# Patient Record
Sex: Male | Born: 1980 | Race: Black or African American | Hispanic: No | Marital: Married | Smoking: Current every day smoker
Health system: Southern US, Community
[De-identification: ages and names within clinical notes are randomized; demographics above are authoritative.]

## PROBLEM LIST (undated history)

## (undated) HISTORY — PX: ELBOW SURGERY: SHX618

## (undated) HISTORY — PX: FRACTURE SURGERY: SHX138

---
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
## 2005-05-14 ENCOUNTER — Ambulatory Visit (HOSPITAL_COMMUNITY): Admission: RE | Admit: 2005-05-14 | Discharge: 2005-05-14 | Payer: Self-pay | Admitting: Emergency Medicine

## 2005-05-14 ENCOUNTER — Emergency Department (HOSPITAL_COMMUNITY): Admission: EM | Admit: 2005-05-14 | Discharge: 2005-05-14 | Payer: Self-pay | Admitting: Emergency Medicine

## 2005-05-18 ENCOUNTER — Ambulatory Visit (HOSPITAL_COMMUNITY): Admission: RE | Admit: 2005-05-18 | Discharge: 2005-05-19 | Payer: Self-pay | Admitting: Otolaryngology

## 2005-06-20 ENCOUNTER — Ambulatory Visit (HOSPITAL_COMMUNITY): Admission: RE | Admit: 2005-06-20 | Discharge: 2005-06-20 | Payer: Self-pay | Admitting: Otolaryngology

## 2005-06-22 ENCOUNTER — Ambulatory Visit (HOSPITAL_COMMUNITY): Admission: RE | Admit: 2005-06-22 | Discharge: 2005-06-22 | Payer: Self-pay | Admitting: Otolaryngology

## 2009-02-15 ENCOUNTER — Emergency Department (HOSPITAL_COMMUNITY): Admission: EM | Admit: 2009-02-15 | Discharge: 2009-02-15 | Payer: Self-pay | Admitting: Emergency Medicine

## 2009-04-09 ENCOUNTER — Emergency Department (HOSPITAL_COMMUNITY): Admission: EM | Admit: 2009-04-09 | Discharge: 2009-04-09 | Payer: Self-pay | Admitting: Family Medicine

## 2009-04-27 ENCOUNTER — Emergency Department (HOSPITAL_COMMUNITY): Admission: EM | Admit: 2009-04-27 | Discharge: 2009-04-27 | Payer: Self-pay | Admitting: Family Medicine

## 2010-07-08 NOTE — Op Note (Signed)
NAME:  Gary Hughes, Gary Hughes             ACCOUNT NO.:  [id number]   MEDICAL RECORD NO.:  [id number]          PATIENT TYPE:  AMB   LOCATION:  SDS                          FACILITY:  [facility]   PHYSICIAN:  Suzanna Obey, M.D.       DATE OF BIRTH:  Dec 21, 1980   DATE OF PROCEDURE:  06/22/2005  DATE OF DISCHARGE:                                 OPERATIVE REPORT   PREOPERATIVE DIAGNOSIS:  Mandibulomaxillary fixation.   POSTOPERATIVE DIAGNOSIS:  Mandibulomaxillary fixation.   OPERATION PERFORMED:  Removal of wires and screws.   SURGEON:  Suzanna Obey, M.D.   ANESTHESIA:  Local and MAC.   ESTIMATED BLOOD LOSS:  The estimated blood loss was less than 5 mL.   INDICATIONS:  This is a 30 year old with a previous mandibular fracture.  He  has now been wired for about five weeks.  He is not having any pain.  The  Panorex looks like the fracture line is still in line.  He was informed the  risks and benefits of the procedure.  All his questions were answered and  consent was obtained.   DESCRIPTION OF THE OPERATION:  The patient was brought to the operating room  and sedated with a local as well as anesthesia.  He was clipped of his  wires.  The wires were removed from around the screws and the screws were  removed after making a small incision over the mucosa of the head of the  screw.  They were removed without difficulty.   The patient tolerated this very well.  The patient was taken to recovery in  stable condition.   COUNTS:  The counts were correct.           ______________________________  Suzanna Obey, M.D.     JB/MEDQ  D:  06/22/2005  T:  06/23/2005  Job:  [id number]
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
## 2010-07-11 ENCOUNTER — Emergency Department (HOSPITAL_COMMUNITY): Payer: Self-pay

## 2010-07-11 ENCOUNTER — Emergency Department (HOSPITAL_COMMUNITY)
Admission: EM | Admit: 2010-07-11 | Discharge: 2010-07-11 | Disposition: A | Discharge status: Home / Self Care | Payer: Self-pay | Attending: Emergency Medicine | Admitting: Emergency Medicine

## 2010-07-11 DIAGNOSIS — S61209A Unspecified open wound of unspecified finger without damage to nail, initial encounter: Secondary | ICD-10-CM | POA: Insufficient documentation

## 2010-07-11 DIAGNOSIS — R110 Nausea: Secondary | ICD-10-CM | POA: Insufficient documentation

## 2010-07-11 DIAGNOSIS — M79609 Pain in unspecified limb: Secondary | ICD-10-CM | POA: Insufficient documentation

## 2010-07-11 DIAGNOSIS — W28XXXA Contact with powered lawn mower, initial encounter: Secondary | ICD-10-CM | POA: Insufficient documentation

## 2010-07-11 DIAGNOSIS — S62639A Displaced fracture of distal phalanx of unspecified finger, initial encounter for closed fracture: Secondary | ICD-10-CM | POA: Insufficient documentation

## 2010-07-11 DIAGNOSIS — F17200 Nicotine dependence, unspecified, uncomplicated: Secondary | ICD-10-CM | POA: Insufficient documentation

## 2010-07-25 ENCOUNTER — Inpatient Hospital Stay (INDEPENDENT_AMBULATORY_CARE_PROVIDER_SITE_OTHER)
Admission: RE | Admit: 2010-07-25 | Discharge: 2010-07-25 | Disposition: A | Discharge status: Home / Self Care | Payer: Self-pay | Source: Ambulatory Visit | Attending: Family Medicine | Admitting: Family Medicine

## 2010-07-25 DIAGNOSIS — Z0489 Encounter for examination and observation for other specified reasons: Secondary | ICD-10-CM

## 2010-11-22 ENCOUNTER — Emergency Department (HOSPITAL_COMMUNITY)
Admission: EM | Admit: 2010-11-22 | Discharge: 2010-11-22 | Disposition: A | Discharge status: Home / Self Care | Payer: Self-pay | Attending: Emergency Medicine | Admitting: Emergency Medicine

## 2010-11-22 DIAGNOSIS — K089 Disorder of teeth and supporting structures, unspecified: Secondary | ICD-10-CM | POA: Insufficient documentation

## 2010-11-22 DIAGNOSIS — K029 Dental caries, unspecified: Secondary | ICD-10-CM | POA: Insufficient documentation

## 2011-07-28 ENCOUNTER — Encounter (HOSPITAL_COMMUNITY): Payer: Self-pay

## 2011-07-28 ENCOUNTER — Emergency Department (HOSPITAL_COMMUNITY)
Admission: EM | Admit: 2011-07-28 | Discharge: 2011-07-28 | Disposition: A | Discharge status: Home / Self Care | Payer: Self-pay | Attending: Emergency Medicine | Admitting: Emergency Medicine

## 2011-07-28 DIAGNOSIS — X131XXA Other contact with steam and other hot vapors, initial encounter: Secondary | ICD-10-CM | POA: Insufficient documentation

## 2011-07-28 DIAGNOSIS — T22139A Burn of first degree of unspecified upper arm, initial encounter: Secondary | ICD-10-CM | POA: Insufficient documentation

## 2011-07-28 DIAGNOSIS — T22119A Burn of first degree of unspecified forearm, initial encounter: Secondary | ICD-10-CM | POA: Insufficient documentation

## 2011-07-28 DIAGNOSIS — T300 Burn of unspecified body region, unspecified degree: Secondary | ICD-10-CM

## 2011-07-28 DIAGNOSIS — F17200 Nicotine dependence, unspecified, uncomplicated: Secondary | ICD-10-CM | POA: Insufficient documentation

## 2011-07-28 DIAGNOSIS — X12XXXA Contact with other hot fluids, initial encounter: Secondary | ICD-10-CM | POA: Insufficient documentation

## 2011-07-28 MED ORDER — HYDROCODONE-ACETAMINOPHEN 5-325 MG PO TABS: 1.0000 | ORAL_TABLET | Freq: Once | ORAL | Status: AC

## 2011-07-28 MED ORDER — NAPROXEN 500 MG PO TABS: 500.0000 mg | ORAL_TABLET | Freq: Once | ORAL | Status: DC

## 2011-07-28 MED ORDER — HYDROCODONE-ACETAMINOPHEN 5-325 MG PO TABS
1.0000 | ORAL_TABLET | Freq: Once | ORAL | Status: AC
  Administered 2011-07-28: 1 via ORAL
  Filled 2011-07-28: qty 1

## 2011-07-28 MED ORDER — NAPROXEN 500 MG PO TABS
500.0000 mg | ORAL_TABLET | Freq: Once | ORAL | Status: DC
  Filled 2011-07-28 (×2): qty 1
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
## 2011-07-28 NOTE — Discharge Instructions (Signed)

## 2011-10-28 ENCOUNTER — Emergency Department (HOSPITAL_COMMUNITY)
Admission: EM | Admit: 2011-10-28 | Discharge: 2011-10-28 | Disposition: A | Discharge status: Home / Self Care | Payer: No Typology Code available for payment source | Attending: Emergency Medicine | Admitting: Emergency Medicine

## 2011-10-28 ENCOUNTER — Encounter (HOSPITAL_COMMUNITY): Payer: Self-pay | Admitting: *Deleted

## 2011-10-28 ENCOUNTER — Emergency Department (HOSPITAL_COMMUNITY): Payer: Self-pay

## 2011-10-28 DIAGNOSIS — Y92410 Unspecified street and highway as the place of occurrence of the external cause: Secondary | ICD-10-CM | POA: Insufficient documentation

## 2011-10-28 DIAGNOSIS — F17200 Nicotine dependence, unspecified, uncomplicated: Secondary | ICD-10-CM | POA: Insufficient documentation

## 2011-10-28 DIAGNOSIS — S335XXA Sprain of ligaments of lumbar spine, initial encounter: Secondary | ICD-10-CM | POA: Insufficient documentation

## 2011-10-28 DIAGNOSIS — S139XXA Sprain of joints and ligaments of unspecified parts of neck, initial encounter: Secondary | ICD-10-CM | POA: Insufficient documentation

## 2011-10-28 DIAGNOSIS — S39012A Strain of muscle, fascia and tendon of lower back, initial encounter: Secondary | ICD-10-CM

## 2011-10-28 DIAGNOSIS — S161XXA Strain of muscle, fascia and tendon at neck level, initial encounter: Secondary | ICD-10-CM

## 2011-10-28 MED ORDER — IBUPROFEN 800 MG PO TABS: 800.0000 mg | ORAL_TABLET | Freq: Three times a day (TID) | ORAL | Status: AC | PRN
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
## 2011-10-28 NOTE — ED Notes (Signed)
Care transferred, report received Katie, RN. 
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
## 2011-10-28 NOTE — ED Notes (Signed)
Patient transported to X-ray 

## 2011-10-28 NOTE — ED Provider Notes (Signed)
Medical screening examination/treatment/procedure(s) were performed by non-physician practitioner and as supervising physician I was immediately available for consultation/collaboration.   Lyanne Co, MD 10/28/11 [phone]

## 2011-10-28 NOTE — ED Notes (Signed)
Pt returned to Pod A bed 3 from radiology. Tolerated well.

## 2012-09-29 ENCOUNTER — Emergency Department (HOSPITAL_COMMUNITY)
Admission: EM | Admit: 2012-09-29 | Discharge: 2012-09-29 | Disposition: A | Discharge status: Home / Self Care | Payer: Self-pay | Attending: Emergency Medicine | Admitting: Emergency Medicine

## 2012-09-29 DIAGNOSIS — F17200 Nicotine dependence, unspecified, uncomplicated: Secondary | ICD-10-CM | POA: Insufficient documentation

## 2012-09-29 DIAGNOSIS — Z7982 Long term (current) use of aspirin: Secondary | ICD-10-CM | POA: Insufficient documentation

## 2012-09-29 DIAGNOSIS — K089 Disorder of teeth and supporting structures, unspecified: Secondary | ICD-10-CM | POA: Insufficient documentation

## 2012-09-29 DIAGNOSIS — H9209 Otalgia, unspecified ear: Secondary | ICD-10-CM | POA: Insufficient documentation

## 2012-09-29 DIAGNOSIS — K0889 Other specified disorders of teeth and supporting structures: Secondary | ICD-10-CM

## 2012-09-29 DIAGNOSIS — Z792 Long term (current) use of antibiotics: Secondary | ICD-10-CM | POA: Insufficient documentation

## 2012-09-29 MED ORDER — PENICILLIN V POTASSIUM 250 MG PO TABS: 250.0000 mg | ORAL_TABLET | Freq: Four times a day (QID) | ORAL | Status: AC

## 2012-09-29 MED ORDER — HYDROCODONE-ACETAMINOPHEN 5-325 MG PO TABS: 1.0000 | ORAL_TABLET | ORAL | Status: DC | PRN

## 2012-09-29 NOTE — ED Provider Notes (Signed)
  CSN: [id number]     Arrival date & time 09/29/12  4098 History     First MD Initiated Contact with Patient 09/29/12 1008     Chief Complaint  Patient presents with  . Dental Pain  . Otalgia   (Consider location/radiation/quality/duration/timing/severity/associated sxs/prior Treatment) HPI Comments: Pt states that he cracked his tooth a month ago and now he is having pain that radiates to his ear:pt has been using orajel without relief  Patient is a 32 y.o. male presenting with tooth pain and ear pain. The history is provided by the patient. No language interpreter was used.  Dental Pain Location:  Upper Quality:  Aching Severity:  Moderate Onset quality:  Gradual Timing:  Constant Chronicity:  New Otalgia   No past medical history on file. Past Surgical History  Procedure Laterality Date  . Elbow surgery     No family history on file. History  Substance Use Topics  . Smoking status: Current Every Day Smoker -- 1.00 packs/day  . Smokeless tobacco: Never Used  . Alcohol Use: Yes    Review of Systems  HENT: Positive for ear pain.   Respiratory: Negative.   Cardiovascular: Negative.     Allergies  Review of patient's allergies indicates no known allergies.  Home Medications   Current Outpatient Rx  Name  Route  Sig  Dispense  Refill  . Aspirin-Caffeine (BAYER BACK & BODY PAIN EX ST) 500-32.5 MG TABS   Oral   Take 1 tablet by mouth daily as needed (pain).         . benzocaine (ORAJEL) 10 % mucosal gel   Mouth/Throat   Use as directed 1 application in the mouth or throat as needed for pain.         Marland Kitchen HYDROcodone-acetaminophen (VICODIN) 5-500 MG per tablet   Oral   Take 1 tablet by mouth once.         Marland Kitchen HYDROcodone-acetaminophen (NORCO/VICODIN) 5-325 MG per tablet   Oral   Take 1 tablet by mouth every 4 (four) hours as needed for pain.   10 tablet   0   . penicillin v potassium (VEETID) 250 MG tablet   Oral   Take 1 tablet (250 mg total) by mouth  4 (four) times daily.   40 tablet   0    BP 140/78  Pulse 54  Temp(Src) 98.1 F (36.7 C) (Oral)  Resp 22  SpO2 100% Physical Exam  Nursing note and vitals reviewed. Constitutional: He appears well-developed and well-nourished.  HENT:  Right Ear: External ear normal.  Left Ear: External ear normal.  Mouth/Throat: Oropharynx is clear and moist.  Left upper dentition has multiple decayed and cracked teeth  Cardiovascular: Normal rate and regular rhythm.   Pulmonary/Chest: Effort normal and breath sounds normal.    ED Course   Procedures (including critical care time)  Labs Reviewed - No data to display No results found. 1. Toothache     MDM  Will treat for infection and have follow up with dentist  Teressa Lower, NP 09/29/12 1019

## 2012-09-29 NOTE — ED Provider Notes (Signed)
   Medical screening examination/treatment/procedure(s) were performed by non-physician practitioner and as supervising physician I was immediately available for consultation/collaboration.   Lyanne Co, MD 09/29/12 1025

## 2012-09-29 NOTE — ED Notes (Signed)
Pt reports left upper jaw tooth pain, reports "hole in tooth", pain 10/10 x2 days. Reports now has earache from tooth pain. Has tried oragel with no relief.

## 2012-09-30 ENCOUNTER — Telehealth (HOSPITAL_COMMUNITY): Payer: Self-pay | Admitting: *Deleted

## 2012-10-31 ENCOUNTER — Emergency Department (HOSPITAL_COMMUNITY)
Admission: EM | Admit: 2012-10-31 | Discharge: 2012-10-31 | Discharge status: Against Medical Advice | Payer: Self-pay | Attending: Emergency Medicine | Admitting: Emergency Medicine

## 2012-10-31 ENCOUNTER — Encounter (HOSPITAL_COMMUNITY): Payer: Self-pay | Admitting: Emergency Medicine

## 2012-10-31 DIAGNOSIS — K089 Disorder of teeth and supporting structures, unspecified: Secondary | ICD-10-CM | POA: Insufficient documentation

## 2012-10-31 DIAGNOSIS — R220 Localized swelling, mass and lump, head: Secondary | ICD-10-CM | POA: Insufficient documentation

## 2012-10-31 DIAGNOSIS — F17200 Nicotine dependence, unspecified, uncomplicated: Secondary | ICD-10-CM | POA: Insufficient documentation
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
## 2012-10-31 NOTE — ED Notes (Signed)
Pt stated that he is in a lot aof pain. Pt reassured that he would be seen as soon as possible

## 2012-10-31 NOTE — ED Notes (Signed)
Seen for same tooth one month ago. Pt did not get rx filled. Did not follow up with dentist

## 2012-10-31 NOTE — ED Notes (Signed)
Pt reports severe l/dental pain x 24 hrs
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
## 2018-01-30 ENCOUNTER — Encounter (HOSPITAL_COMMUNITY): Payer: Self-pay | Admitting: Emergency Medicine

## 2018-01-30 ENCOUNTER — Emergency Department (HOSPITAL_COMMUNITY)
Admission: EM | Admit: 2018-01-30 | Discharge: 2018-01-30 | Disposition: A | Discharge status: Home / Self Care | Payer: Self-pay | Attending: Emergency Medicine | Admitting: Emergency Medicine

## 2018-01-30 ENCOUNTER — Other Ambulatory Visit: Payer: Self-pay

## 2018-01-30 DIAGNOSIS — Z79899 Other long term (current) drug therapy: Secondary | ICD-10-CM | POA: Insufficient documentation

## 2018-01-30 DIAGNOSIS — F17210 Nicotine dependence, cigarettes, uncomplicated: Secondary | ICD-10-CM | POA: Insufficient documentation

## 2018-01-30 DIAGNOSIS — J101 Influenza due to other identified influenza virus with other respiratory manifestations: Secondary | ICD-10-CM | POA: Insufficient documentation

## 2018-01-30 DIAGNOSIS — R69 Illness, unspecified: Secondary | ICD-10-CM

## 2018-01-30 DIAGNOSIS — J111 Influenza due to unidentified influenza virus with other respiratory manifestations: Secondary | ICD-10-CM

## 2018-01-30 MED ORDER — CETIRIZINE HCL 5 MG PO TABS: 5.0000 mg | ORAL_TABLET | Freq: Every day | ORAL | 0 refills | Status: DC

## 2018-01-30 MED ORDER — FLUTICASONE PROPIONATE 50 MCG/ACT NA SUSP: 1.0000 | Freq: Every day | NASAL | 2 refills | Status: DC

## 2018-01-30 MED ORDER — NAPROXEN 500 MG PO TABS: 500.0000 mg | ORAL_TABLET | Freq: Two times a day (BID) | ORAL | 0 refills | Status: DC

## 2018-01-30 MED ORDER — BENZONATATE 100 MG PO CAPS: 100.0000 mg | ORAL_CAPSULE | Freq: Three times a day (TID) | ORAL | 0 refills | Status: DC

## 2018-01-30 NOTE — ED Triage Notes (Signed)
Pt c/o nasal congestion x 3 days. Afebrile.

## 2018-01-30 NOTE — ED Provider Notes (Signed)
[facility]   CSN: [id number] Arrival date & time: 01/30/18  2010     History   Chief Complaint Chief Complaint  Patient presents with  . URI    HPI Gary Hughes is a 37 y.o. male who presents to ED for 2 to 3-day history of URI symptoms including dry cough, head pressure and sinus congestion, rhinorrhea and subjective fever.  Sick contacts including his children with similar symptoms.  He did not receive his influenza vaccine this year.  He has been using over-the-counter medications such as NyQuil with only mild improvement in symptoms.  Denies any shortness of breath, wheezing, productive cough or abdominal pain.  HPI  History reviewed. No pertinent past medical history.  There are no active problems to display for this patient.   Past Surgical History:  Procedure Laterality Date  . ELBOW SURGERY          Home Medications    Prior to Admission medications   Medication Sig Start Date End Date Taking? Authorizing Provider  benzonatate (TESSALON) 100 MG capsule Take 1 capsule (100 mg total) by mouth every 8 (eight) hours. 01/30/18   Brooklyn Alfredo, PA-C  cetirizine (ZYRTEC) 5 MG tablet Take 1 tablet (5 mg total) by mouth daily. 01/30/18   Rashidi Loh, PA-C  fluticasone (FLONASE) 50 MCG/ACT nasal spray Place 1 spray into both nostrils daily. 01/30/18   Easton Sivertson, PA-C  naproxen (NAPROSYN) 500 MG tablet Take 1 tablet (500 mg total) by mouth 2 (two) times daily. 01/30/18   Lajuanda Penick, PA-C  oxyCODONE-acetaminophen (PERCOCET/ROXICET) 5-325 MG per tablet Take 1 tablet by mouth once.    [provider]    Family History No family history on file.  Social History Social History   Tobacco Use  . Smoking status: Current Every Day Smoker    Packs/day: 1.00  . Smokeless tobacco: Never Used  Substance Use Topics  . Alcohol use: No  . Drug use: Yes    Types: Marijuana    Comment: denies      Allergies   Patient has no known allergies.   Review of Systems Review of Systems  Constitutional: Positive for chills and fever.  HENT: Positive for congestion, sinus pressure and sneezing. Negative for sore throat and trouble swallowing.   Respiratory: Positive for cough.      Physical Exam Updated Vital Signs BP 127/69 (BP Location: Right Arm)   Pulse 65   Temp 99.2 F (37.3 C) (Oral)   Resp 16   SpO2 98%   Physical Exam  Constitutional: He appears well-developed and well-nourished. No distress.  HENT:  Head: Normocephalic and atraumatic.  Nose: Mucosal edema present. Right sinus exhibits maxillary sinus tenderness and frontal sinus tenderness. Left sinus exhibits maxillary sinus tenderness and frontal sinus tenderness.  Mouth/Throat: Uvula is midline and oropharynx is clear and moist.  Eyes: Conjunctivae and EOM are normal. No scleral icterus.  Neck: Normal range of motion.  Cardiovascular: Normal rate, regular rhythm and normal heart sounds.  Pulmonary/Chest: Effort normal and breath sounds normal. No respiratory distress.  Neurological: He is alert.  Skin: No rash noted. He is not diaphoretic.  Psychiatric: He has a normal mood and affect.  Nursing note and vitals reviewed.    ED Treatments / Results  Labs (all labs ordered are listed, but only abnormal results are displayed) Labs Reviewed - No data to display  EKG None  Radiology No results found.  Procedures Procedures (including  critical care time)  Medications Ordered in ED Medications - No data to display   Initial Impression / Assessment and Plan / ED Course  I have reviewed the triage vital signs and the nursing notes.  Pertinent labs & imaging results that were available during my care of the patient were reviewed by me and considered in my medical decision making (see chart for details).     37 year old male presents to ED for URI symptoms for the past 2 to 3 days.  Sick contacts  with similar symptoms.  He is afebrile here although does report subjective fever at home.  He has sinus pain and pressure as well as tenderness to palpation.  Lungs are clear to auscultation bilaterally.  He is not tachycardic, tachypneic or hypoxic.  Suspect that symptoms are due to influenza-like illness or other viral illness.  He is out of the window for Tamiflu administration and is not immunocompromised.  I feel that he will benefit from supportive measures with antitussives, anti-inflammatories, Flonase and antihistamines.  Will advise him to return to ED for any severe worsening symptoms.  Patient is hemodynamically stable, in NAD, and able to ambulate in the ED. Evaluation does not show pathology that would require ongoing emergent intervention or inpatient treatment. I explained the diagnosis to the patient. Pain has been managed and has no complaints prior to discharge. Patient is comfortable with above plan and is stable for discharge at this time. All questions were answered prior to disposition. Strict return precautions for returning to the ED were discussed. Encouraged follow up with PCP.    Portions of this note were generated with Scientist, clinical (histocompatibility and immunogenetics). Dictation errors may occur despite best attempts at proofreading.   Final Clinical Impressions(s) / ED Diagnoses   Final diagnoses:  Influenza-like illness    ED Discharge Orders         Ordered    fluticasone (FLONASE) 50 MCG/ACT nasal spray  Daily     01/30/18 2052    cetirizine (ZYRTEC) 5 MG tablet  Daily     01/30/18 2052    naproxen (NAPROSYN) 500 MG tablet  2 times daily     01/30/18 2052    benzonatate (TESSALON) 100 MG capsule  Every 8 hours     01/30/18 2052           Dietrich Pates, PA-C 01/30/18 2055    Alvira Monday, MD 01/31/18 1309

## 2018-01-30 NOTE — Discharge Instructions (Addendum)
Return to ED for worsening symptoms, trouble breathing or trouble swallowing, vomiting or coughing up blood, chest pain. °

## 2020-04-09 ENCOUNTER — Ambulatory Visit
Admission: EM | Admit: 2020-04-09 | Discharge: 2020-04-09 | Disposition: A | Discharge status: Home / Self Care | Payer: Self-pay | Attending: Emergency Medicine | Admitting: Emergency Medicine

## 2020-04-09 ENCOUNTER — Ambulatory Visit: Admission: EM | Admit: 2020-04-09 | Discharge: 2020-04-09 | Discharge status: Absent without leave | Payer: Self-pay

## 2020-04-09 ENCOUNTER — Other Ambulatory Visit: Payer: Self-pay

## 2020-04-09 DIAGNOSIS — Z1152 Encounter for screening for COVID-19: Secondary | ICD-10-CM

## 2020-04-09 DIAGNOSIS — Z20822 Contact with and (suspected) exposure to covid-19: Secondary | ICD-10-CM

## 2020-04-09 DIAGNOSIS — J209 Acute bronchitis, unspecified: Secondary | ICD-10-CM

## 2020-04-09 DIAGNOSIS — J069 Acute upper respiratory infection, unspecified: Secondary | ICD-10-CM

## 2020-04-09 MED ORDER — ALBUTEROL SULFATE HFA 108 (90 BASE) MCG/ACT IN AERS: 1.0000 | INHALATION_SPRAY | Freq: Four times a day (QID) | RESPIRATORY_TRACT | 0 refills | Status: AC | PRN

## 2020-04-09 MED ORDER — BENZONATATE 200 MG PO CAPS: 200.0000 mg | ORAL_CAPSULE | Freq: Three times a day (TID) | ORAL | 0 refills | Status: AC | PRN

## 2020-04-09 MED ORDER — DM-GUAIFENESIN ER 30-600 MG PO TB12: 1.0000 | ORAL_TABLET | Freq: Two times a day (BID) | ORAL | 0 refills | Status: DC

## 2020-04-09 MED ORDER — PREDNISONE 20 MG PO TABS: 40.0000 mg | ORAL_TABLET | Freq: Every day | ORAL | 0 refills | Status: AC

## 2020-04-09 NOTE — ED Provider Notes (Signed)
[facility]    CSN: [id number] Arrival date & time: 04/09/20  1515      History   Chief Complaint Chief Complaint  Patient presents with  . Nasal Congestion    HPI Gary Hughes is a 40 y.o. male presenting today for evaluation of URI symptoms and cough.  Reports over the past 2 days he has had cough, congestion, wheezing, fatigue and generalized weakness.  Reports associated wheezing.  Endorses tobacco use.  Denies GI symptoms.  Denies close sick contacts.   HPI  History reviewed. No pertinent past medical history.  There are no problems to display for this patient.   Past Surgical History:  Procedure Laterality Date  . ELBOW SURGERY         Home Medications    Prior to Admission medications   Medication Sig Start Date End Date Taking? Authorizing Provider  albuterol (VENTOLIN HFA) 108 (90 Base) MCG/ACT inhaler Inhale 1-2 puffs into the lungs every 6 (six) hours as needed for wheezing or shortness of breath. 04/09/20  Yes Chantz Montefusco C, PA-C  benzonatate (TESSALON) 200 MG capsule Take 1 capsule (200 mg total) by mouth 3 (three) times daily as needed for up to 7 days for cough. 04/09/20 04/16/20 Yes Indie Boehne C, PA-C  dextromethorphan-guaiFENesin (MUCINEX DM) 30-600 MG 12hr tablet Take 1 tablet by mouth 2 (two) times daily. 04/09/20  Yes Danyon Mcginness C, PA-C  predniSONE (DELTASONE) 20 MG tablet Take 2 tablets (40 mg total) by mouth daily with breakfast for 5 days. 04/09/20 04/14/20 Yes Aerica Rincon, Junius Creamer, PA-C    Family History History reviewed. No pertinent family history.  Social History Social History   Tobacco Use  . Smoking status: Current Every Day Smoker    Packs/day: 1.00  . Smokeless tobacco: Never Used  Substance Use Topics  . Alcohol use: Yes    Comment: occ  . Drug use: Not Currently    Types: Marijuana    Comment: denies     Allergies   Patient has no known allergies.   Review of Systems Review of Systems   Constitutional: Positive for fatigue. Negative for activity change, appetite change, chills and fever.  HENT: Positive for congestion. Negative for ear pain, rhinorrhea, sinus pressure, sore throat and trouble swallowing.   Eyes: Negative for discharge and redness.  Respiratory: Positive for cough, chest tightness and wheezing. Negative for shortness of breath.   Cardiovascular: Negative for chest pain.  Gastrointestinal: Negative for abdominal pain, diarrhea, nausea and vomiting.  Musculoskeletal: Negative for myalgias.  Skin: Negative for rash.  Neurological: Negative for dizziness, light-headedness and headaches.     Physical Exam Triage Vital Signs ED Triage Vitals [04/09/20 1613]  Enc Vitals Group     BP 122/89     Pulse Rate 68     Resp 18     Temp 98.4 F (36.9 C)     Temp Source Oral     SpO2 97 %     Weight      Height      Head Circumference      Peak Flow      Pain Score 6     Pain Loc      Pain Edu?      Excl. in GC?    No data found.  Updated Vital Signs BP 122/89 (BP Location: Left Arm)   Pulse 68   Temp 98.4 F (36.9 C) (Oral)   Resp 18   SpO2 97%  Visual Acuity Right Eye Distance:   Left Eye Distance:   Bilateral Distance:    Right Eye Near:   Left Eye Near:    Bilateral Near:     Physical Exam Vitals and nursing note reviewed.  Constitutional:      Appearance: He is well-developed and well-nourished.     Comments: No acute distress  HENT:     Head: Normocephalic and atraumatic.     Ears:     Comments: Bilateral ears without tenderness to palpation of external auricle, tragus and mastoid, EAC's without erythema or swelling, TM's with good bony landmarks and cone of light. Non erythematous.     Nose: Nose normal.     Mouth/Throat:     Comments: Oral mucosa pink and moist, no tonsillar enlargement or exudate. Posterior pharynx patent and nonerythematous, no uvula deviation or swelling. Normal phonation. Eyes:     Conjunctiva/sclera:  Conjunctivae normal.  Cardiovascular:     Rate and Rhythm: Normal rate.  Pulmonary:     Effort: Pulmonary effort is normal. No respiratory distress.     Comments: Breathing comfortably at rest, CTABL, no wheezing, rales or other adventitious sounds auscultated Abdominal:     General: There is no distension.  Musculoskeletal:        General: Normal range of motion.     Cervical back: Neck supple.  Skin:    General: Skin is warm and dry.  Neurological:     Mental Status: He is alert and oriented to person, place, and time.  Psychiatric:        Mood and Affect: Mood and affect normal.      UC Treatments / Results  Labs (all labs ordered are listed, but only abnormal results are displayed) Labs Reviewed  NOVEL CORONAVIRUS, NAA    EKG   Radiology No results found.  Procedures Procedures (including critical care time)  Medications Ordered in UC Medications - No data to display  Initial Impression / Assessment and Plan / UC Course  I have reviewed the triage vital signs and the nursing notes.  Pertinent labs & imaging results that were available during my care of the patient were reviewed by me and considered in my medical decision making (see chart for details).     Covid test pending-exam reassuring today, vital signs stable, opting to go ahead and treat for bronchitis given his reported wheezing and history of tobacco use, Mucinex and Tessalon for cough and congestion, prednisone and albuterol, continue to monitor symptoms and breathing, rest and fluids.  Discussed strict return precautions. Patient verbalized understanding and is agreeable with plan.  Final Clinical Impressions(s) / UC Diagnoses   Final diagnoses:  Encounter for screening laboratory testing for COVID-19 virus  Viral URI with cough  Acute bronchitis, unspecified organism     Discharge Instructions     Covid test pending, monitor MyChart for results Mucinex DM twice daily for cough and  congestion Benzonatate/Tessalon every 8 hours for cough Prednisone daily for 5 days to help with inflammation and chest congestion Albuterol inhaler as needed for shortness of breath chest tightness and wheezing Follow-up if not improving or worsening    ED Prescriptions    Medication Sig Dispense Auth. Provider   benzonatate (TESSALON) 200 MG capsule Take 1 capsule (200 mg total) by mouth 3 (three) times daily as needed for up to 7 days for cough. 28 capsule Cythia Bachtel C, PA-C   predniSONE (DELTASONE) 20 MG tablet Take 2 tablets (40 mg total) by  mouth daily with breakfast for 5 days. 10 tablet Yulitza Shorts C, PA-C   dextromethorphan-guaiFENesin (MUCINEX DM) 30-600 MG 12hr tablet Take 1 tablet by mouth 2 (two) times daily. 20 tablet Claude Waldman C, PA-C   albuterol (VENTOLIN HFA) 108 (90 Base) MCG/ACT inhaler Inhale 1-2 puffs into the lungs every 6 (six) hours as needed for wheezing or shortness of breath. 8 g Trevin Gartrell, [place] C, PA-C     PDMP not reviewed this encounter.   Lew Dawes, [place] 04/10/20 [phone]

## 2020-04-09 NOTE — ED Triage Notes (Signed)
Pt c/o chest/nasal congestion, nausea, headache, and weakness x2 days. States has been out of work for 2 days and will need a work note.

## 2020-04-09 NOTE — Discharge Instructions (Signed)
Covid test pending, monitor MyChart for results Mucinex DM twice daily for cough and congestion Benzonatate/Tessalon every 8 hours for cough Prednisone daily for 5 days to help with inflammation and chest congestion Albuterol inhaler as needed for shortness of breath chest tightness and wheezing Follow-up if not improving or worsening

## 2020-04-10 LAB — NOVEL CORONAVIRUS, NAA: SARS-CoV-2, NAA: NOT DETECTED

## 2020-04-10 LAB — SARS-COV-2, NAA 2 DAY TAT

## 2021-08-04 ENCOUNTER — Ambulatory Visit
Admission: EM | Admit: 2021-08-04 | Discharge: 2021-08-04 | Disposition: A | Discharge status: Home / Self Care | Payer: Commercial Managed Care - HMO | Attending: Family Medicine | Admitting: Family Medicine

## 2021-08-04 DIAGNOSIS — K0889 Other specified disorders of teeth and supporting structures: Secondary | ICD-10-CM

## 2021-08-04 MED ORDER — KETOROLAC TROMETHAMINE 30 MG/ML IJ SOLN: 30.0000 mg | Freq: Once | INTRAMUSCULAR | Status: DC

## 2021-08-04 MED ORDER — IBUPROFEN 800 MG PO TABS: 800.0000 mg | ORAL_TABLET | Freq: Three times a day (TID) | ORAL | 0 refills | Status: AC | PRN

## 2021-08-04 MED ORDER — AMOXICILLIN 875 MG PO TABS: 875.0000 mg | ORAL_TABLET | Freq: Two times a day (BID) | ORAL | 0 refills | Status: AC

## 2021-08-04 NOTE — Discharge Instructions (Addendum)
You have been given a shot of Toradol 30 mg today.  Take amoxicillin 875 mg--1 tab twice daily for 7 days  Take ibuprofen 800 mg--1 tab every 8 hours as needed for pain.

## 2021-08-04 NOTE — ED Provider Notes (Addendum)
[facility]    CSN: [id number] Arrival date & time: 08/04/21  1156      History   Chief Complaint Chief Complaint  Patient presents with   Dental Pain    HPI Gary Hughes is a 41 y.o. male.    Dental Pain  Here for some left-sided oral pain that has worsened overnight.  May be some subjective fever in the last 24 hours.  It hurts to chew.  No cough or upper respiratory symptoms    No past medical history on file.  There are no problems to display for this patient.   Past Surgical History:  Procedure Laterality Date   ELBOW SURGERY         Home Medications    Prior to Admission medications   Medication Sig Start Date End Date Taking? Authorizing Provider  amoxicillin (AMOXIL) 875 MG tablet Take 1 tablet (875 mg total) by mouth 2 (two) times daily for 7 days. 08/04/21 08/11/21 Yes Elizebeth Kluesner, Janace Aris, MD  ibuprofen (ADVIL) 800 MG tablet Take 1 tablet (800 mg total) by mouth every 8 (eight) hours as needed (pain). 08/04/21  Yes Pranavi Aure, Janace Aris, MD  albuterol (VENTOLIN HFA) 108 (90 Base) MCG/ACT inhaler Inhale 1-2 puffs into the lungs every 6 (six) hours as needed for wheezing or shortness of breath. 04/09/20   Wieters, Junius Creamer, PA-C    Family History No family history on file.  Social History Social History   Tobacco Use   Smoking status: Every Day    Packs/day: 1.00    Types: Cigarettes   Smokeless tobacco: Never  Substance Use Topics   Alcohol use: Yes    Comment: occ   Drug use: Not Currently    Types: Marijuana    Comment: denies     Allergies   Patient has no known allergies.   Review of Systems Review of Systems   Physical Exam Triage Vital Signs ED Triage Vitals  Enc Vitals Group     BP 08/04/21 1208 105/71     Pulse Rate 08/04/21 1208 96     Resp 08/04/21 1208 18     Temp 08/04/21 1208 98.5 F (36.9 C)     Temp Source 08/04/21 1208 Oral     SpO2 08/04/21 1208 95 %     Weight --      Height --      Head  Circumference --      Peak Flow --      Pain Score 08/04/21 1207 7     Pain Loc --      Pain Edu? --      Excl. in GC? --    No data found.  Updated Vital Signs BP 105/71   Pulse 96   Temp 98.5 F (36.9 C) (Oral)   Resp 18   SpO2 95%   Visual Acuity Right Eye Distance:   Left Eye Distance:   Bilateral Distance:    Right Eye Near:   Left Eye Near:    Bilateral Near:     Physical Exam Vitals reviewed.  Constitutional:      General: He is not in acute distress.    Appearance: He is not ill-appearing, toxic-appearing or diaphoretic.  HENT:     Nose: Nose normal.     Mouth/Throat:     Mouth: Mucous membranes are moist.     Comments: There is a broken tooth at the posterior left molar on the upper dental ridge.  There  is no abscess noted.  There is some mild swelling around the dental ridge there.  No swelling in the buccal mucosa or in the cheek Cardiovascular:     Rate and Rhythm: Normal rate and regular rhythm.  Pulmonary:     Effort: Pulmonary effort is normal.     Breath sounds: Normal breath sounds.  Skin:    Coloration: Skin is not pale.  Neurological:     Mental Status: He is alert and oriented to person, place, and time.  Psychiatric:        Behavior: Behavior normal.      UC Treatments / Results  Labs (all labs ordered are listed, but only abnormal results are displayed) Labs Reviewed - No data to display  EKG   Radiology No results found.  Procedures Procedures (including critical care time)  Medications Ordered in UC Medications - No data to display   Initial Impression / Assessment and Plan / UC Course  I have reviewed the triage vital signs and the nursing notes.  Pertinent labs & imaging results that were available during my care of the patient were reviewed by me and considered in my medical decision making (see chart for details).     We will give him a shot of Toradol and treat with ibuprofen and amoxicillin.  We discussed him  calling for follow-up with a dentist here in the next week or 2  Toradol was not given at discharge, so the orders been taking off Final Clinical Impressions(s) / UC Diagnoses   Final diagnoses:  Pain, dental     Discharge Instructions      You have been given a shot of Toradol 30 mg today.  Take amoxicillin 875 mg--1 tab twice daily for 7 days  Take ibuprofen 800 mg--1 tab every 8 hours as needed for pain.       ED Prescriptions     Medication Sig Dispense Auth. Provider   amoxicillin (AMOXIL) 875 MG tablet Take 1 tablet (875 mg total) by mouth 2 (two) times daily for 7 days. 14 tablet Zeph Riebel, Janace Aris, MD   ibuprofen (ADVIL) 800 MG tablet Take 1 tablet (800 mg total) by mouth every 8 (eight) hours as needed (pain). 21 tablet Leanny Moeckel, Janace Aris, MD      I have reviewed the PDMP during this encounter.   Zenia Resides, MD 08/04/21 1219    Zenia Resides, MD 08/04/21 1221    Zenia Resides, MD 08/04/21 [phone]

## 2021-08-04 NOTE — ED Triage Notes (Signed)
Patient presents to Urgent Care with complaints of dental pain on L side upper since last week. Patient reports no dentist. Pt has been applying Orajel to area.

## 2022-05-03 ENCOUNTER — Ambulatory Visit
Admission: EM | Admit: 2022-05-03 | Discharge: 2022-05-03 | Disposition: A | Discharge status: Home / Self Care | Payer: Commercial Managed Care - HMO | Attending: Physician Assistant | Admitting: Physician Assistant

## 2022-05-03 DIAGNOSIS — J069 Acute upper respiratory infection, unspecified: Secondary | ICD-10-CM

## 2022-05-03 DIAGNOSIS — J22 Unspecified acute lower respiratory infection: Secondary | ICD-10-CM

## 2022-05-03 MED ORDER — DM-GUAIFENESIN ER 30-600 MG PO TB12: 1.0000 | ORAL_TABLET | Freq: Two times a day (BID) | ORAL | 0 refills | Status: DC

## 2022-05-03 NOTE — ED Provider Notes (Signed)
[facility]    CSN: [id number] Arrival date & time: 05/03/22  1153      History   Chief Complaint Chief Complaint  Patient presents with   chest cold    HPI Gary Hughes is a 42 y.o. male.   42 year old male presents with cough and congestion.  Patient indicates for the past 3 days he has been having mild upper respiratory congestion with rhinitis postnasal drip clear to yellow production.  He also indicates having mild chest congestion with intermittent cough, chest production has been thick clear to yellow.  Patient indicates he is feeling better today.  He is without wheezing or shortness of breath.  Patient indicates he has not been having any fever, chills, body aches or pains.  He is tolerating fluids well.  Patient does indicates that he smokes on a regular basis.     History reviewed. No pertinent past medical history.  There are no problems to display for this patient.   Past Surgical History:  Procedure Laterality Date   ELBOW SURGERY         Home Medications    Prior to Admission medications   Medication Sig Start Date End Date Taking? Authorizing Provider  dextromethorphan-guaiFENesin (MUCINEX DM) 30-600 MG 12hr tablet Take 1 tablet by mouth 2 (two) times daily. 05/03/22  Yes Nyoka Lint, PA-C  albuterol (VENTOLIN HFA) 108 (90 Base) MCG/ACT inhaler Inhale 1-2 puffs into the lungs every 6 (six) hours as needed for wheezing or shortness of breath. 04/09/20   Wieters, Hallie C, PA-C  ibuprofen (ADVIL) 800 MG tablet Take 1 tablet (800 mg total) by mouth every 8 (eight) hours as needed (pain). 08/04/21   Barrett Henle, MD    Family History History reviewed. No pertinent family history.  Social History Social History   Tobacco Use   Smoking status: Every Day    Packs/day: 1.00    Types: Cigarettes   Smokeless tobacco: Never  Substance Use Topics   Alcohol use: Yes    Comment: occ   Drug use: Not Currently    Types: Marijuana     Comment: denies     Allergies   Patient has no known allergies.   Review of Systems Review of Systems  Respiratory:  Positive for cough.      Physical Exam Triage Vital Signs ED Triage Vitals  Enc Vitals Group     BP 05/03/22 1217 129/66     Pulse Rate 05/03/22 1217 60     Resp 05/03/22 1217 16     Temp 05/03/22 1217 97.8 F (36.6 C)     Temp Source 05/03/22 1217 Oral     SpO2 05/03/22 1217 98 %     Weight --      Height --      Head Circumference --      Peak Flow --      Pain Score 05/03/22 1218 0     Pain Loc --      Pain Edu? --      Excl. in [place]? --    No data found.  Updated Vital Signs BP 129/66 (BP Location: Right Arm)   Pulse 60   Temp 97.8 F (36.6 C) (Oral)   Resp 16   SpO2 98%   Visual Acuity Right Eye Distance:   Left Eye Distance:   Bilateral Distance:    Right Eye Near:   Left Eye Near:    Bilateral Near:     Physical  Exam Constitutional:      Appearance: Normal appearance.  HENT:     Right Ear: Ear canal normal. Tympanic membrane is injected.     Left Ear: Ear canal normal. Tympanic membrane is injected.     Mouth/Throat:     Mouth: Mucous membranes are moist.     Pharynx: Oropharynx is clear.  Cardiovascular:     Rate and Rhythm: Normal rate and regular rhythm.     Heart sounds: Normal heart sounds.  Pulmonary:     Effort: Pulmonary effort is normal.     Breath sounds: Normal breath sounds and air entry. No wheezing, rhonchi or rales.  Lymphadenopathy:     Cervical: No cervical adenopathy.  Neurological:     Mental Status: He is alert.      UC Treatments / Results  Labs (all labs ordered are listed, but only abnormal results are displayed) Labs Reviewed - No data to display  EKG   Radiology No results found.  Procedures Procedures (including critical care time)  Medications Ordered in UC Medications - No data to display  Initial Impression / Assessment and Plan / UC Course  I have reviewed the triage vital  signs and the nursing notes.  Pertinent labs & imaging results that were available during my care of the patient were reviewed by me and considered in my medical decision making (see chart for details).    Plan: The diagnosis to be treated with the following: 1.  Upper respiratory infection: A.  Mucinex DM every 12 hours to control cough and congestion. 2.  Lower respiratory infection: A.  Mucinex dm every 12 hours to control cough and congestion. 3.  Advised follow-up PCP or return to urgent care as needed. Final Clinical Impressions(s) / UC Diagnoses   Final diagnoses:  Acute upper respiratory infection  Lower respiratory infection     Discharge Instructions      Advised to take the Mucinex DM every 12 hours to help control cough and congestion.  Advised to increase fluid intake to help thin the secretions.  Advised follow-up PCP return to urgent care as needed.    ED Prescriptions     Medication Sig Dispense Auth. Provider   dextromethorphan-guaiFENesin (MUCINEX DM) 30-600 MG 12hr tablet Take 1 tablet by mouth 2 (two) times daily. 20 tablet Nyoka Lint, PA-C      PDMP not reviewed this encounter.   Nyoka Lint, PA-C 05/03/22 1228

## 2022-05-03 NOTE — ED Triage Notes (Signed)
Pt c/o chest cold, head cold, coughing at night, nausea   Onset ~ Monday   Currently smokes.

## 2022-05-03 NOTE — Discharge Instructions (Signed)
Advised to take the Mucinex DM every 12 hours to help control cough and congestion.  Advised to increase fluid intake to help thin the secretions.  Advised follow-up PCP return to urgent care as needed.

## 2023-06-09 DIAGNOSIS — R109 Unspecified abdominal pain: Secondary | ICD-10-CM | POA: Insufficient documentation

## 2023-06-09 DIAGNOSIS — R197 Diarrhea, unspecified: Secondary | ICD-10-CM | POA: Insufficient documentation

## 2023-06-09 NOTE — ED Triage Notes (Signed)
 Patient endorses generalized abdominal pain post eating a burger from [employer] at 3 pm on Friday. Denies n/v/d. Per patient he states "this just feels like bubble gut type stuff.

## 2023-06-10 ENCOUNTER — Emergency Department (HOSPITAL_BASED_OUTPATIENT_CLINIC_OR_DEPARTMENT_OTHER)
Admission: EM | Admit: 2023-06-10 | Discharge: 2023-06-10 | Disposition: A | Discharge status: Home / Self Care | Payer: Self-pay | Attending: Emergency Medicine | Admitting: Emergency Medicine

## 2023-06-10 ENCOUNTER — Encounter (HOSPITAL_BASED_OUTPATIENT_CLINIC_OR_DEPARTMENT_OTHER): Payer: Self-pay | Admitting: Emergency Medicine

## 2023-06-10 ENCOUNTER — Other Ambulatory Visit: Payer: Self-pay

## 2023-06-10 DIAGNOSIS — R109 Unspecified abdominal pain: Secondary | ICD-10-CM

## 2023-06-10 DIAGNOSIS — R197 Diarrhea, unspecified: Secondary | ICD-10-CM

## 2023-06-10 MED ORDER — ALUM & MAG HYDROXIDE-SIMETH 200-200-20 MG/5ML PO SUSP
30.0000 mL | Freq: Once | ORAL | Status: AC
Start: 2023-06-10 — End: 2023-06-10
  Administered 2023-06-10: 30 mL via ORAL
  Filled 2023-06-10: qty 30

## 2023-06-10 NOTE — ED Provider Notes (Signed)
 [place] EMERGENCY DEPARTMENT AT [facility]  Provider Note  CSN: [id number] Arrival date & time: 06/09/23 2349  History Chief Complaint  Patient presents with   Abdominal Pain    Gary Hughes is a 43 y.o. male with no known PMH reports he has had cramping abdominal pain and diarrhea since eating at a backyard cook out yesterday. He denies any vomiting but did have one episode of nausea. He denies fever. No blood in stool.    Home Medications Prior to Admission medications   Medication Sig Start Date End Date Taking? Authorizing Provider  albuterol  (VENTOLIN  HFA) 108 (90 Base) MCG/ACT inhaler Inhale 1-2 puffs into the lungs every 6 (six) hours as needed for wheezing or shortness of breath. 04/09/20   Wieters, Hallie C, PA-C  dextromethorphan-guaiFENesin  (MUCINEX  DM) 30-600 MG 12hr tablet Take 1 tablet by mouth 2 (two) times daily. 05/03/22   Gretel Leaven, PA-C  ibuprofen  (ADVIL ) 800 MG tablet Take 1 tablet (800 mg total) by mouth every 8 (eight) hours as needed (pain). 08/04/21   Banister, Pamela K, MD     Allergies    Patient has no known allergies.   Review of Systems   Review of Systems Please see HPI for pertinent positives and negatives  Physical Exam BP 123/86 (BP Location: Right Arm)   Pulse 92   Temp 97.9 F (36.6 C) (Oral)   Resp 18   Ht 5\' 11"  (1.803 m)   Wt 83.9 kg   SpO2 96%   BMI 25.80 kg/m   Physical Exam Vitals and nursing note reviewed.  Constitutional:      Appearance: Normal appearance.  HENT:     Head: Normocephalic and atraumatic.     Nose: Nose normal.     Mouth/Throat:     Mouth: Mucous membranes are moist.  Eyes:     Extraocular Movements: Extraocular movements intact.     Conjunctiva/sclera: Conjunctivae normal.  Cardiovascular:     Rate and Rhythm: Normal rate.  Pulmonary:     Effort: Pulmonary effort is normal.     Breath sounds: Normal breath sounds.  Abdominal:     General: Abdomen is flat.     Palpations:  Abdomen is soft.     Tenderness: There is no abdominal tenderness. There is no guarding. Negative signs include Murphy's sign and McBurney's sign.  Musculoskeletal:        General: No swelling. Normal range of motion.     Cervical back: Neck supple.  Skin:    General: Skin is warm and dry.  Neurological:     General: No focal deficit present.     Mental Status: He is alert.  Psychiatric:        Mood and Affect: Mood normal.     ED Results / Procedures / Treatments   EKG None  Procedures Procedures  Medications Ordered in the ED Medications  alum & mag hydroxide-simeth (MAALOX/MYLANTA) 200-200-20 MG/5ML suspension 30 mL (has no administration in time range)    Initial Impression and Plan  Patient here with abdominal pain and diarrhea after eating at a backyard cookout. He suspects some bad potato salad. He has reassuring vitals and exam. Offered blood work and/or imaging but he declines. States he wants some medication to help with the symptoms and work note. Advised without labs, we cannot fully evaluate the cause of his symptoms and he expresses understanding. Will give GI cocktail. Recommend PCP follow up, RTED for any other concerns.  ED Course       MDM Rules/Calculators/A&P Medical Decision Making Problems Addressed: Abdominal cramping: acute illness or injury Diarrhea, unspecified type: acute illness or injury  Risk OTC drugs.     Final Clinical Impression(s) / ED Diagnoses Final diagnoses:  Diarrhea, unspecified type  Abdominal cramping    Rx / DC Orders ED Discharge Orders     None        Charmayne Cooper, MD 06/10/23 [phone]

## 2023-06-10 NOTE — ED Triage Notes (Signed)
 At this time patient denies having blood work done. Requesting indigestion medication to help with pain.

## 2023-06-25 ENCOUNTER — Other Ambulatory Visit: Payer: Self-pay

## 2023-06-25 ENCOUNTER — Emergency Department (HOSPITAL_COMMUNITY): Payer: Self-pay

## 2023-06-25 ENCOUNTER — Observation Stay (HOSPITAL_COMMUNITY)
Admission: EM | Admit: 2023-06-25 | Discharge: 2023-06-26 | Disposition: A | Discharge status: Home / Self Care | Payer: Self-pay | Attending: Family Medicine | Admitting: Family Medicine

## 2023-06-25 ENCOUNTER — Encounter (HOSPITAL_COMMUNITY): Payer: Self-pay | Admitting: Emergency Medicine

## 2023-06-25 DIAGNOSIS — F17200 Nicotine dependence, unspecified, uncomplicated: Secondary | ICD-10-CM | POA: Insufficient documentation

## 2023-06-25 DIAGNOSIS — Z79899 Other long term (current) drug therapy: Secondary | ICD-10-CM | POA: Insufficient documentation

## 2023-06-25 DIAGNOSIS — T50901A Poisoning by unspecified drugs, medicaments and biological substances, accidental (unintentional), initial encounter: Principal | ICD-10-CM | POA: Diagnosis present

## 2023-06-25 LAB — CBC
HCT: 42.8 % (ref 39.0–52.0)
Hemoglobin: 13.7 g/dL (ref 13.0–17.0)
MCH: 26.8 pg (ref 26.0–34.0)
MCHC: 32 g/dL (ref 30.0–36.0)
MCV: 83.8 fL (ref 80.0–100.0)
Platelets: 347 10*3/uL (ref 150–400)
RBC: 5.11 MIL/uL (ref 4.22–5.81)
RDW: 16.1 % — ABNORMAL HIGH (ref 11.5–15.5)
WBC: 14.9 10*3/uL — ABNORMAL HIGH (ref 4.0–10.5)
nRBC: 0 % (ref 0.0–0.2)

## 2023-06-25 LAB — COMPREHENSIVE METABOLIC PANEL WITH GFR
ALT: 20 U/L (ref 0–44)
AST: 41 U/L (ref 15–41)
Albumin: 4.3 g/dL (ref 3.5–5.0)
Alkaline Phosphatase: 102 U/L (ref 38–126)
Anion gap: 11 (ref 5–15)
BUN: 12 mg/dL (ref 6–20)
CO2: 27 mmol/L (ref 22–32)
Calcium: 9.2 mg/dL (ref 8.9–10.3)
Chloride: 101 mmol/L (ref 98–111)
Creatinine, Ser: 1.37 mg/dL — ABNORMAL HIGH (ref 0.61–1.24)
GFR, Estimated: >60 mL/min (ref >60)
Glucose, Bld: 136 mg/dL — ABNORMAL HIGH (ref 70–99)
Potassium: 3.9 mmol/L (ref 3.5–5.1)
Sodium: 139 mmol/L (ref 135–145)
Total Bilirubin: 0.6 mg/dL (ref 0.0–1.2)
Total Protein: 7.9 g/dL (ref 6.5–8.1)

## 2023-06-25 LAB — ETHANOL: Alcohol, Ethyl (B): <15 mg/dL (ref <15)

## 2023-06-25 LAB — ACETAMINOPHEN LEVEL: Acetaminophen (Tylenol), Serum: <10 ug/mL — ABNORMAL LOW (ref 10–30)

## 2023-06-25 LAB — SALICYLATE LEVEL: Salicylate Lvl: <7 mg/dL — ABNORMAL LOW (ref 7.0–30.0)

## 2023-06-25 MED ORDER — ONDANSETRON HCL 4 MG/2ML IJ SOLN
4.0000 mg | Freq: Once | INTRAMUSCULAR | Status: AC
  Administered 2023-06-25: 4 mg via INTRAVENOUS
  Filled 2023-06-25: qty 2

## 2023-06-25 MED ORDER — NALOXONE HCL 0.4 MG/ML IJ SOLN
0.4000 mg | Freq: Once | INTRAMUSCULAR | Status: AC
  Administered 2023-06-25: 0.4 mg via INTRAVENOUS
  Filled 2023-06-25: qty 1

## 2023-06-25 MED ORDER — NICOTINE 7 MG/24HR TD PT24
7.0000 mg | MEDICATED_PATCH | Freq: Every day | TRANSDERMAL | Status: DC | PRN
Start: 2023-06-25 — End: 2023-06-26

## 2023-06-25 MED ORDER — ACETAMINOPHEN 325 MG PO TABS
650.0000 mg | ORAL_TABLET | Freq: Four times a day (QID) | ORAL | Status: DC | PRN
  Administered 2023-06-26: 650 mg via ORAL
  Filled 2023-06-25: qty 2

## 2023-06-25 MED ORDER — ONDANSETRON HCL 4 MG PO TABS: 4.0000 mg | ORAL_TABLET | Freq: Four times a day (QID) | ORAL | Status: DC | PRN

## 2023-06-25 MED ORDER — DIAZEPAM 5 MG/ML IJ SOLN
5.0000 mg | Freq: Once | INTRAMUSCULAR | Status: AC
  Administered 2023-06-25: 5 mg via INTRAVENOUS
  Filled 2023-06-25: qty 2

## 2023-06-25 MED ORDER — ONDANSETRON HCL 4 MG/2ML IJ SOLN: 4.0000 mg | Freq: Four times a day (QID) | INTRAMUSCULAR | Status: DC | PRN

## 2023-06-25 MED ORDER — ACETAMINOPHEN 650 MG RE SUPP
650.0000 mg | Freq: Four times a day (QID) | RECTAL | Status: DC | PRN
Start: 2023-06-25 — End: 2023-06-26

## 2023-06-25 MED ORDER — ALBUTEROL SULFATE (2.5 MG/3ML) 0.083% IN NEBU: 2.5000 mg | INHALATION_SOLUTION | RESPIRATORY_TRACT | Status: DC | PRN

## 2023-06-25 MED ORDER — SODIUM CHLORIDE 0.9 % IV BOLUS
1000.0000 mL | Freq: Once | INTRAVENOUS | Status: AC
  Administered 2023-06-25: 1000 mL via INTRAVENOUS

## 2023-06-25 NOTE — H&P (Signed)
 History and Physical    Patient: Gary Hughes:[id number] DOB: 06-23-1980 DOA: 06/25/2023 DOS: the patient was seen and examined on 06/25/2023 PCP: Pcp, No  Patient coming from: Home  Chief Complaint:  Chief Complaint  Patient presents with   Drug Overdose       HPI:  43 y.o. M with no significant PMHx presented with being found unresponsive.  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  Took a pill that he thought was MDMA some time after midnight, not sure.  This morning at 8a, EMS were activated by unknown party and found him poorly responsive with pinpoint pupils.  Gave Narcan and he awoke.  In the ER, electrolytes, renal function and CBC unremarkable.  Salicylates, acetaminophen  and alcohol undetectable.  ECG and CXR normal.  Became less responsive again, given Narcan again. Was going to be observed and discharged but after 8 hours in the ER, became poorly responsive a third time, pinpoint pupils, given a third dose Narcan and hospitalists asked to observe overnight.      Review of Systems  Constitutional:  Negative for chills and fever.  Respiratory:  Negative for cough and shortness of breath.   Cardiovascular:  Negative for chest pain.  Gastrointestinal:  Negative for abdominal pain, nausea and vomiting.  Musculoskeletal:  Negative for back pain, joint pain and neck pain.  Neurological:  Positive for loss of consciousness. Negative for sensory change, speech change, focal weakness, seizures, weakness and headaches.  Psychiatric/Behavioral:  Positive for substance abuse. Negative for depression, hallucinations, memory loss and suicidal ideas. The patient is nervous/anxious. The patient does not have insomnia.   All other systems reviewed and are negative.    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  Smokes.  Uses alcohol and illicit drugs.    No Known Allergies  History reviewed. No pertinent family history.  Prior to Admission medications   Medication Sig Start Date End Date Taking? Authorizing Provider  albuterol  (VENTOLIN  HFA) 108 (90 Base) MCG/ACT inhaler Inhale 1-2 puffs into the lungs every 6 (six) hours as needed for wheezing or shortness of breath. 04/09/20  Yes Wieters, Hallie C, PA-C  ibuprofen  (ADVIL ) 800 MG tablet Take 1 tablet (800 mg total) by mouth every 8 (eight) hours as needed (pain). 08/04/21  Yes Ann Keto, MD    Physical Exam: Vitals:   06/25/23 1615 06/25/23 1630 06/25/23 1645 06/25/23 1654  BP:      Pulse: 63 60 63   Resp:  (!) 7 14   Temp:    98 F (36.7 C)  TempSrc:    Oral  SpO2: 94% 98% 97%   Weight:      Height:       General appearance:   alert and in no distress.  Responds appropriately to questions.  Eye contact, dress and hygiene appropriate. HEENT:  Anicteric, conjunctivae and sclerae normal without injection or icterus, lids and lashes normal.  Visual tracking smooth.  OP moist without lesions, dentition normal, lips normal, normal auditory acuity   Cor:  RRR, without murmurs, rubs.  JVP normal, no LE edema Resp:  Normal respiratory rate and rhythm.  CTAB without rales or wheezes. MSK: Symmetrical without gross deformities of the hands, large joints,  or legs. Skin:  cap refill normal, Skin intact without significant rashes or lesions than a small abrasion on left shoulder. Neuro:  Speech is fluent.  Naming is grossly intact, patient's recall, both recent and remote, seem within normal limits.  Muscle tone normal, face symmetric  Psych:  Attention span and concentration are within normal limits.  Affect normal.  appropriate thought content and normal rate of speech, thought process linear           Data Reviewed: Comprehensive metabolic panel shows  normal electrolytes.  Creatinine 1.37, baseline unknown. LFTs normal. CBC shows leukocytosis, likely stress demargination, normal hemoglobin and platelets Acetaminophen  and salicylate level negative Chest x-ray, personally reviewed, shows no airspace disease or opacities EKG, personally reviewed, shows normal sinus rhythm    Assessment and Plan: * Overdose Unknown substance.  Presumably long-acting opiate given rapid response to Narcan but long duration of action. - Monitor on EtCO2 and continuous pulse ox - PRN Narcan for EtCO2 >60 - IV fluids           Advance Care Planning: Full code  Consults: None needed  Family Communication: None present  Severity of Illness: The appropriate patient status for this patient is OBSERVATION. Observation status is judged to be reasonable and necessary in order to provide the required intensity of service to ensure the patient's safety. The patient's presenting symptoms, physical exam findings, and initial radiographic and laboratory data in the context of their medical condition is felt to place them at decreased risk for further clinical deterioration. Furthermore, it is anticipated that the patient will be medically stable for discharge from the hospital within 2 midnights of admission.   Author: Ephriam Hashimoto, MD 06/25/2023 5:10 PM  For on call review [email or web address].

## 2023-06-25 NOTE — Hospital Course (Signed)
 43 y.o. M with no significant PMHx presented with being found unresponsive.  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  Took a pill that he thought was MDMA some time after midnight, not sure.  This morning at 8a, EMS were activated by unknown party and found him poorly responsive with pinpoint pupils.  Gave Narcan and he awoke.  In the ER, electrolytes, renal function and CBC unremarkable.  Salicylates, acetaminophen  and alcohol undetectable.  ECG and CXR normal.  Became less responsive again, given Narcan again. Was going to be observed and discharged but after 8 hours in the ER, became poorly responsive a third time, pinpoint pupils, given a third dose Narcan and hospitalists asked to observe overnight.

## 2023-06-25 NOTE — ED Notes (Addendum)
 Spoke with Lovette Rud, RN at [facility]  -  Keep patient 6 hours after being given narcan. Estimated time of discharge is 1400 at this time unless patient needs more narcan.

## 2023-06-25 NOTE — Assessment & Plan Note (Signed)
 Unknown substance.  Presumably long-acting opiate given rapid response to Narcan but long duration of action. - Monitor on EtCO2 and continuous pulse ox - PRN Narcan for EtCO2 >60 - IV fluids

## 2023-06-25 NOTE — ED Notes (Signed)
 ED TO INPATIENT HANDOFF REPORT  Name/Age/Gender Gary Hughes 43 y.o. male  Code Status   Home/SNF/Other Home  Chief Complaint Overdose [T50.901A]  Level of Care/Admitting Diagnosis ED Disposition     ED Disposition  Admit   Condition  --   Comment  Hospital Area: [facility] [[id number]]  Level of Care: Telemetry [5]  Admit to tele based on following criteria: Other see comments  Comments: overdose, needs ETCO2  May place patient in observation at [facility] or [facility] if equivalent level of care is available:: Yes  Covid Evaluation: Asymptomatic - no recent exposure (last 10 days) testing not required  Diagnosis: Overdose [202577]  Admitting Physician: Ephriam Hashimoto [[id number]]  Attending Physician: Ephriam Hashimoto [[id number]]          Medical History History reviewed. No pertinent past medical history.  Allergies No Known Allergies  IV Location/Drains/Wounds Patient Lines/Drains/Airways Status     Active Line/Drains/Airways     Name Placement date Placement time Site Days   Peripheral IV 06/25/23 18 G Anterior;Distal;Right;Upper Arm 06/25/23  0836  Arm  less than 1            Labs/Imaging Results for orders placed or performed during the hospital encounter of 06/25/23 (from the past 48 hours)  Comprehensive metabolic panel     Status: Abnormal   Collection Time: 06/25/23  8:42 AM  Result Value Ref Range   Sodium 139 135 - 145 mmol/L   Potassium 3.9 3.5 - 5.1 mmol/L   Chloride 101 98 - 111 mmol/L   CO2 27 22 - 32 mmol/L   Glucose, Bld 136 (H) 70 - 99 mg/dL    Comment: Glucose reference range applies only to samples taken after fasting for at least 8 hours.   BUN 12 6 - 20 mg/dL   Creatinine, Ser 3.08 (H) 0.61 - 1.24 mg/dL   Calcium 9.2 8.9 - 65.7 mg/dL   Total Protein 7.9 6.5 - 8.1 g/dL   Albumin 4.3 3.5 - 5.0 g/dL   AST 41 15 - 41 U/L   ALT 20 0 - 44 U/L   Alkaline Phosphatase 102 38 - 126 U/L   Total  Bilirubin 0.6 0.0 - 1.2 mg/dL   GFR, Estimated >84 >69 mL/min    Comment: (NOTE) Calculated using the CKD-EPI Creatinine Equation (2021)    Anion gap 11 5 - 15    Comment: Performed at [facility], [address] [place], [place] [address]  cbc     Status: Abnormal   Collection Time: 06/25/23  8:42 AM  Result Value Ref Range   WBC 14.9 (H) 4.0 - 10.5 K/uL   RBC 5.11 4.22 - 5.81 MIL/uL   Hemoglobin 13.7 13.0 - 17.0 g/dL   HCT 84.1 32.4 - 40.1 %   MCV 83.8 80.0 - 100.0 fL   MCH 26.8 26.0 - 34.0 pg   MCHC 32.0 30.0 - 36.0 g/dL   RDW 02.7 (H) 25.3 - 66.4 %   Platelets 347 150 - 400 K/uL   nRBC 0.0 0.0 - 0.2 %    Comment: Performed at [facility], [address], [place], [place] [address]  Ethanol     Status: None   Collection Time: 06/25/23  8:43 AM  Result Value Ref Range   Alcohol, Ethyl (B) <15 <15 mg/dL    Comment: Please note change in reference range. (NOTE) For medical purposes only. Performed at [facility],  [address] [place], [place] [address]   Salicylate level     Status: Abnormal   Collection Time: 06/25/23  9:22 AM  Result Value Ref Range   Salicylate Lvl <7.0 (L) 7.0 - 30.0 mg/dL    Comment: Performed at [facility], [address], [place], [place] [address]  Acetaminophen  level     Status: Abnormal   Collection Time: 06/25/23  9:22 AM  Result Value Ref Range   Acetaminophen  (Tylenol ), Serum <10 (L) 10 - 30 ug/mL    Comment: (NOTE) Therapeutic concentrations vary significantly. A range of 10-30 ug/mL  may be an effective concentration for many patients. However, some  are best treated at concentrations outside of this range. Acetaminophen  concentrations >150 ug/mL at 4 hours after ingestion  and >50 ug/mL at 12 hours after ingestion are often associated with  toxic reactions.  Performed at [facility], [address] [place], [place] [address]     DG Chest Portable 1 View Result Date: 06/25/2023 CLINICAL DATA:  DIB EXAM: PORTABLE CHEST 1 VIEW COMPARISON:  None Available. FINDINGS: The heart size and mediastinal contours are within normal limits. Both lungs are clear. The visualized skeletal structures are unremarkable. IMPRESSION: No active disease. Electronically Signed   By: Fredrich Jefferson M.D.   On: 06/25/2023 10:08    Pending Labs Unresulted Labs (From admission, onward)     Start     Ordered   06/25/23 0841  Rapid urine drug screen (hospital performed)  Once,   STAT        06/25/23 0840            Vitals/Pain Today's Vitals   06/25/23 1615 06/25/23 1630 06/25/23 1645 06/25/23 1654  BP:      Pulse: 63 60 63   Resp:  (!) 7 14   Temp:    98 F (36.7 C)  TempSrc:    Oral  SpO2: 94% 98% 97%   Weight:      Height:      PainSc:        Isolation Precautions No active isolations  Medications Medications  sodium chloride 0.9 % bolus 1,000 mL (0 mLs Intravenous Stopped 06/25/23 1111)  ondansetron (ZOFRAN) injection 4 mg (4 mg Intravenous Given 06/25/23 0908)  diazepam (VALIUM) injection 5 mg (5 mg Intravenous Given 06/25/23 0908)  naloxone (NARCAN) injection 0.4 mg (0.4 mg Intravenous Given 06/25/23 0919)  ondansetron (ZOFRAN) injection 4 mg (4 mg Intravenous Given 06/25/23 1613)  naloxone (NARCAN) injection 0.4 mg (0.4 mg Intravenous Given 06/25/23 1615)    Mobility walks

## 2023-06-25 NOTE — ED Triage Notes (Signed)
 Patient [place] [facility] [place] for possible [place]. Eyes were pinpoint slow respiration and oxygen saturation decreased upon arrival with ems. Given 2mg  Narcan IN.  Narcan effective patient alert and oriented drinking an energy drink when nurse enters room.  Patient admits to ectasy this morning.  Pupils still are slightly pinpoint.

## 2023-06-25 NOTE — ED Provider Notes (Signed)
 [place] EMERGENCY DEPARTMENT AT [facility] Provider Note  CSN: [id number] Arrival date & time: 06/25/23 4098  Chief Complaint(s) Drug Overdose  HPI Gary Hughes is a 43 y.o. male with past medical history as below, significant for homeless who presents to the ED with complaint of suspected overdose.  Pt reports he was partying last night, he took some ecstasy and drank etoh, thinks the ecstasy was possibly laced with another illicit substance. He denies si or hi. Reports he is very anxious/scared, has some nausea. Denies similar symptoms in the past. Felt normal prior to ingestion of substance last night.   On ems arrival bradypnea and pinpoint pupils noted, he received narcan 2mg  IN that improved his symptoms.   Drinking energy drink on my arrival to the room    Past Medical History History reviewed. No pertinent past medical history. Patient Active Problem List   Diagnosis Date Noted   Overdose 06/25/2023   Home Medication(s) Prior to Admission medications   Medication Sig Start Date End Date Taking? Authorizing Provider  albuterol  (VENTOLIN  HFA) 108 (90 Base) MCG/ACT inhaler Inhale 1-2 puffs into the lungs every 6 (six) hours as needed for wheezing or shortness of breath. 04/09/20   Wieters, Hallie C, PA-C  dextromethorphan-guaiFENesin  (MUCINEX  DM) 30-600 MG 12hr tablet Take 1 tablet by mouth 2 (two) times daily. 05/03/22   Gretel Leaven, PA-C  ibuprofen  (ADVIL ) 800 MG tablet Take 1 tablet (800 mg total) by mouth every 8 (eight) hours as needed (pain). 08/04/21   Ann Keto, MD                                                                                                                                    Past Surgical History Past Surgical History:  Procedure Laterality Date   ELBOW SURGERY     Family History History reviewed. No pertinent family history.  Social History Social History   Tobacco Use   Smoking status: Every Day    Current  packs/day: 1.00    Types: Cigarettes   Smokeless tobacco: Never  Substance Use Topics   Alcohol use: Yes    Comment: occ   Drug use: Yes    Types: Marijuana, Cocaine    Comment: ectasy   Allergies Patient has no known allergies.  Review of Systems A thorough review of systems was obtained and all systems are negative except as noted in the HPI and PMH.   Physical Exam Vital Signs  I have reviewed the triage vital signs BP (!) 142/70   Pulse 63   Temp 97.7 F (36.5 C) (Oral)   Resp 14   Ht 5\' 11"  (1.803 m)   Wt 82.1 kg   SpO2 94%   BMI 25.24 kg/m  Physical Exam Vitals and nursing note reviewed.  Constitutional:      General: He is not in acute distress.    Appearance: Normal appearance. He  is well-developed. He is not ill-appearing.  HENT:     Head: Normocephalic and atraumatic.     Right Ear: External ear normal.     Left Ear: External ear normal.     Nose: Nose normal.     Mouth/Throat:     Mouth: Mucous membranes are moist.  Eyes:     General: No scleral icterus.       Right eye: No discharge.        Left eye: No discharge.  Cardiovascular:     Rate and Rhythm: Normal rate.     Pulses: Normal pulses.  Pulmonary:     Effort: Pulmonary effort is normal. No respiratory distress.     Breath sounds: No stridor.  Abdominal:     General: Abdomen is flat. There is no distension.     Palpations: Abdomen is soft.     Tenderness: There is no guarding.  Musculoskeletal:        General: No deformity.     Cervical back: No rigidity.  Skin:    General: Skin is warm and dry.     Coloration: Skin is not cyanotic, jaundiced or pale.  Neurological:     Mental Status: He is alert and oriented to person, place, and time.     GCS: GCS eye subscore is 4. GCS verbal subscore is 5. GCS motor subscore is 6.  Psychiatric:        Mood and Affect: Mood is anxious.        Speech: Speech normal.        Behavior: Behavior normal. Behavior is cooperative.     Comments: Hyper  verbal      ED Results and Treatments Labs (all labs ordered are listed, but only abnormal results are displayed) Labs Reviewed  COMPREHENSIVE METABOLIC PANEL WITH GFR - Abnormal; Notable for the following components:      Result Value   Glucose, Bld 136 (*)    Creatinine, Ser 1.37 (*)    All other components within normal limits  CBC - Abnormal; Notable for the following components:   WBC 14.9 (*)    RDW 16.1 (*)    All other components within normal limits  SALICYLATE LEVEL - Abnormal; Notable for the following components:   Salicylate Lvl <7.0 (*)    All other components within normal limits  ACETAMINOPHEN  LEVEL - Abnormal; Notable for the following components:   Acetaminophen  (Tylenol ), Serum <10 (*)    All other components within normal limits  ETHANOL  RAPID URINE DRUG SCREEN, HOSP PERFORMED                                                                                                                          Radiology DG Chest Portable 1 View Result Date: 06/25/2023 CLINICAL DATA:  DIB EXAM: PORTABLE CHEST 1 VIEW COMPARISON:  None Available. FINDINGS: The heart size and mediastinal contours are within normal limits. Both lungs are clear. The visualized skeletal structures  are unremarkable. IMPRESSION: No active disease. Electronically Signed   By: Fredrich Jefferson M.D.   On: 06/25/2023 10:08    Pertinent labs & imaging results that were available during my care of the patient were reviewed by me and considered in my medical decision making (see MDM for details).  Medications Ordered in ED Medications  sodium chloride 0.9 % bolus 1,000 mL (0 mLs Intravenous Stopped 06/25/23 1111)  ondansetron (ZOFRAN) injection 4 mg (4 mg Intravenous Given 06/25/23 0908)  diazepam (VALIUM) injection 5 mg (5 mg Intravenous Given 06/25/23 0908)  naloxone (NARCAN) injection 0.4 mg (0.4 mg Intravenous Given 06/25/23 0919)  ondansetron (ZOFRAN) injection 4 mg (4 mg Intravenous Given 06/25/23 1613)   naloxone (NARCAN) injection 0.4 mg (0.4 mg Intravenous Given 06/25/23 1615)                                                                                                                                     Procedures .Critical Care  Performed by: Teddi Favors, DO Authorized by: Teddi Favors, DO   Critical care provider statement:    Critical care time (minutes):  30   Critical care time was exclusive of:  Separately billable procedures and treating other patients   Critical care was necessary to treat or prevent imminent or life-threatening deterioration of the following conditions:  Toxidrome   Critical care was time spent personally by me on the following activities:  Development of treatment plan with patient or surrogate, discussions with consultants, evaluation of patient's response to treatment, examination of patient, ordering and review of laboratory studies, ordering and review of radiographic studies, ordering and performing treatments and interventions, pulse oximetry, re-evaluation of patient's condition, review of old charts and obtaining history from patient or surrogate   Care discussed with: admitting provider     (including critical care time)  Medical Decision Making / ED Course    Medical Decision Making:    Gary Hughes is a 44 y.o. male with past medical history as below, significant for homeless who presents to the ED with complaint of suspected overdose.. The complaint involves an extensive differential diagnosis and also carries with it a high risk of complications and morbidity.  Serious etiology was considered. Ddx includes but is not limited to: overdose, stimulant ingestion, intoxication, etc  Complete initial physical exam performed, notably the patient was in no distress, acutely anxious.    Reviewed and confirmed nursing documentation for past medical history, family history, social history.  Vital signs reviewed.     Brief summary:  43 yo  male w/ hx as above here following presumed OD. Received narcan PTA. He reports taking ecstasy, etoh, and maybe another substance that was laced with the ecstasy. No si or hi   Clinical Course as of 06/25/23 1635  Mon Jun 25, 2023  0922 Pt unresponsive, bradypnea, pinpoint pupils. Given 0.4 mg IV narcan with improvement. He  is on end tidal and [place] , cardiac mon [SG]  0933 WBC(!): 14.9 Likely de margination 2/2 overdose, no fever, doubt infection  [SG]  1018 Poison control requesting 6hr obs [SG]  1205 Feeling better, tolerating PO [SG]  1633 Creatinine(!): 1.37 Unsure baseline, possible aki, give IVF [SG]    Clinical Course User Index [SG] Russella Courts A, DO     Pt requiring repeat narcan dosing, has received narcan x3  (1mg  PTA, 0.4 x2 in the ER). Observed >8 hours in the ED. feel he will need to metabolize overnight. He is agreeable to plan               Additional history obtained: -Additional history obtained from na -External records from outside source obtained and reviewed including: Chart review including previous notes, labs, imaging, consultation notes including  Prior ed notes    Lab Tests: -I ordered, reviewed, and interpreted labs.   The pertinent results include:   Labs Reviewed  COMPREHENSIVE METABOLIC PANEL WITH GFR - Abnormal; Notable for the following components:      Result Value   Glucose, Bld 136 (*)    Creatinine, Ser 1.37 (*)    All other components within normal limits  CBC - Abnormal; Notable for the following components:   WBC 14.9 (*)    RDW 16.1 (*)    All other components within normal limits  SALICYLATE LEVEL - Abnormal; Notable for the following components:   Salicylate Lvl <7.0 (*)    All other components within normal limits  ACETAMINOPHEN  LEVEL - Abnormal; Notable for the following components:   Acetaminophen  (Tylenol ), Serum <10 (*)    All other components within normal limits  ETHANOL  RAPID URINE DRUG SCREEN, HOSP  PERFORMED    Notable for leukocytosis and aki  EKG   EKG Interpretation Date/Time:  Monday Jun 25 2023 08:31:56 EDT Ventricular Rate:  63 PR Interval:  135 QRS Duration:  91 QT Interval:  383 QTC Calculation: 392 R Axis:   82  Text Interpretation: Sinus rhythm Atrial premature complex no stemi no prior Interpretation limited secondary to artifact Confirmed by Russella Courts ([id number]) on 06/25/2023 9:12:02 AM         Imaging Studies ordered: I ordered imaging studies including CXR I independently visualized the following imaging with scope of interpretation limited to determining acute life threatening conditions related to emergency care; findings noted above I agree with the radiologist interpretation If any imaging was obtained with contrast I closely monitored patient for any possible adverse reaction a/w contrast administration in the emergency department   Medicines ordered and prescription drug management: Meds ordered this encounter  Medications   sodium chloride 0.9 % bolus 1,000 mL   ondansetron (ZOFRAN) injection 4 mg   diazepam (VALIUM) injection 5 mg   naloxone (NARCAN) injection 0.4 mg   ondansetron (ZOFRAN) injection 4 mg   naloxone (NARCAN) injection 0.4 mg    -I have reviewed the patients home medicines and have made adjustments as needed   Consultations Obtained: I requested consultation with the [facility],  and discussed lab and imaging findings as well as pertinent plan    Cardiac Monitoring: The patient was maintained on a cardiac monitor.  I personally viewed and interpreted the cardiac monitored which showed an underlying rhythm of: nsr Continuous pulse oximetry interpreted by myself, 94% on 2L.    Social Determinants of Health:  Diagnosis or treatment significantly limited by social determinants of health: current smoker and polysubstance abuse  Reevaluation: After the interventions noted above, I reevaluated the patient and found that they have  improved  Co morbidities that complicate the patient evaluation History reviewed. No pertinent past medical history.    Dispostion: Disposition decision including need for hospitalization was considered, and patient admitted to the hospital.    Final Clinical Impression(s) / ED Diagnoses Final diagnoses:  Accidental overdose, initial encounter        Teddi Favors, DO 06/25/23 1635

## 2023-06-26 LAB — CBC
HCT: 38.2 % — ABNORMAL LOW (ref 39.0–52.0)
Hemoglobin: 12.2 g/dL — ABNORMAL LOW (ref 13.0–17.0)
MCH: 27.6 pg (ref 26.0–34.0)
MCHC: 31.9 g/dL (ref 30.0–36.0)
MCV: 86.4 fL (ref 80.0–100.0)
Platelets: 269 10*3/uL (ref 150–400)
RBC: 4.42 MIL/uL (ref 4.22–5.81)
RDW: 16.3 % — ABNORMAL HIGH (ref 11.5–15.5)
WBC: 8.3 10*3/uL (ref 4.0–10.5)
nRBC: 0 % (ref 0.0–0.2)

## 2023-06-26 LAB — BASIC METABOLIC PANEL WITH GFR
Anion gap: 5 (ref 5–15)
BUN: 13 mg/dL (ref 6–20)
CO2: 27 mmol/L (ref 22–32)
Calcium: 8.5 mg/dL — ABNORMAL LOW (ref 8.9–10.3)
Chloride: 104 mmol/L (ref 98–111)
Creatinine, Ser: 1.06 mg/dL (ref 0.61–1.24)
GFR, Estimated: >60 mL/min (ref >60)
Glucose, Bld: 91 mg/dL (ref 70–99)
Potassium: 4.2 mmol/L (ref 3.5–5.1)
Sodium: 136 mmol/L (ref 135–145)

## 2023-06-26 LAB — HIV ANTIBODY (ROUTINE TESTING W REFLEX): HIV Screen 4th Generation wRfx: NONREACTIVE

## 2023-06-26 NOTE — TOC Transition Note (Signed)
 Transition of Care [facility]) - Discharge Note   Patient Details  Name: Gary Hughes MRN: [id number] Date of Birth: Jan 20, 1981  Transition of Care [facility]) CM/SW Contact:  Jonni Nettle, LCSW Phone Number: 06/26/2023, 9:50 AM   Clinical Narrative:    CSW met with pt at bedside to discuss pt's discharge and SDOH needs. Pt reports he has been living in a house with other friend in [place]. Pt also identified transportation as a barrier. Pt reports desire to find PCP. New patient appointment scheduled with Kasandra Pain on 6/19 at 10:40 AM, at [facility], [place]. Pt reports some food insecurity and difficulty affording food sometimes. CSW added food, housing, and substance abuse treatment resources to AVS. Pt also requesting bus pass upon discharge. CSW provided bus pass to RN, to give to pt. No further TOC needs at this time.    Patient Goals and CMS Choice  Return home    Discharge Placement  Home             Discharge Plan and Services Additional resources added to the After Visit Summary for PCP appointment, food, housing, and substance abuse treatment resources.    Social Drivers of Health (SDOH) Interventions SDOH Screenings   Food Insecurity: Food Insecurity Present (06/25/2023)  Housing: High Risk (06/25/2023)  Transportation Needs: No Transportation Needs (06/25/2023)  Utilities: Not At Risk (06/25/2023)  Tobacco Use: High Risk (06/25/2023)     Readmission Risk Interventions     No data to display          [place], MSW, LCSW 06/26/2023 9:59 AM

## 2023-06-26 NOTE — Discharge Instructions (Signed)
 FOOD PANTRY [facility] [address] [phone]  [facility] [address] [place] [phone]  [facility] [employer] [address] [place] [phone]  [facility] [address] [phone]  [facility] [address] [place], [place] [address] [phone]

## 2023-06-26 NOTE — Discharge Summary (Signed)
 Physician Discharge Summary   Patient: Gary Hughes MRN: [id number] DOB: 1980-09-06  Admit date:     06/25/2023  Discharge date: 06/26/23  Discharge Physician: Ephriam Hashimoto   PCP: Pcp, No     Recommendations at discharge:  Establish with PCP for routine health screenings     Discharge Diagnoses: Principal Problem:   Overdose of unknown substance, presumed long-acting Opiate     Hospital Course: 43 y.o. M with no significant PMHx presented with being found unresponsive.  See H&P.  The patient was partying and took a pill of an unknown substance.  He thought this was MDMA, but instead it caused him to pass out, and he was found by EMS with pinpoint pupils, agonal respirations.  Narcan was administered and he woke up.  In the ER, electrolytes, renal function, CBC, ECG and chest x-ray normal.  While being observed in the ER, he had 2 more episodes where he became sluggish, unresponsive, with pinpoint pupils, which was twice reversed with Narcan.  As a result of the ongoing effects of this overdose, he he was observed overnight.  Overnight, he recovered completely, in the morning his mentation was at baseline, he felt asymptomatic, tolerated diet well.              The [place]  Controlled Substances Registry was reviewed for this patient prior to discharge.   Consultants: None Procedures performed: None  Disposition: Home Diet recommendation:  Regular diet  DISCHARGE MEDICATION: Allergies as of 06/26/2023   No Known Allergies      Medication List     TAKE these medications    albuterol  108 (90 Base) MCG/ACT inhaler Commonly known as: VENTOLIN  HFA Inhale 1-2 puffs into the lungs every 6 (six) hours as needed for wheezing or shortness of breath.   ibuprofen  800 MG tablet Commonly known as: ADVIL  Take 1 tablet (800 mg total) by mouth every 8 (eight) hours as needed (pain).           Discharge Exam: Filed Weights   06/25/23 0830   Weight: 82.1 kg    General: Pt is alert, awake, not in acute distress Cardiovascular: RRR, nl S1-S2, no murmurs appreciated.   No LE edema.   Respiratory: Normal respiratory rate and rhythm.  CTAB without rales or wheezes. Abdominal: Abdomen soft and non-tender.  No distension or HSM.   Neuro/Psych: Strength symmetric in upper and lower extremities.  Judgment and insight appear normal.   Condition at discharge: good  The results of significant diagnostics from this hospitalization (including imaging, microbiology, ancillary and laboratory) are listed below for reference.   Imaging Studies: DG Chest Portable 1 View Result Date: 06/25/2023 CLINICAL DATA:  DIB EXAM: PORTABLE CHEST 1 VIEW COMPARISON:  None Available. FINDINGS: The heart size and mediastinal contours are within normal limits. Both lungs are clear. The visualized skeletal structures are unremarkable. IMPRESSION: No active disease. Electronically Signed   By: Fredrich Jefferson M.D.   On: 06/25/2023 10:08    Microbiology: Results for orders placed or performed during the hospital encounter of 04/09/20  Novel Coronavirus, NAA ([facility])     Status: None   Collection Time: 04/09/20  4:18 PM   Specimen: Nasopharyngeal Swab; Nasopharyngeal(NP) swabs in vial transport medium   Nasopharynge  Result Value Ref Range Status   SARS-CoV-2, NAA Not Detected Not Detected Final    Comment: This nucleic acid amplification test was developed and its performance characteristics determined by [employer]. Nucleic acid amplification tests include RT-PCR  and TMA. This test has not been FDA cleared or approved. This test has been authorized by FDA under an Emergency Use Authorization (EUA). This test is only authorized for the duration of time the declaration that circumstances exist justifying the authorization of the emergency use of in vitro diagnostic tests for detection of SARS-CoV-2 virus and/or diagnosis of COVID-19 infection under  section 564(b)(1) of the Act, 21 U.S.C. 161WRU-0(A) (1), unless the authorization is terminated or revoked sooner. When diagnostic testing is negative, the possibility of a false negative result should be considered in the context of a patient's recent exposures and the presence of clinical signs and symptoms consistent with COVID-19. An individual without symptoms of COVID-19 and who is not shedding SARS-CoV-2 virus wo uld expect to have a negative (not detected) result in this assay.   SARS-COV-2, NAA 2 DAY TAT     Status: None   Collection Time: 04/09/20  4:18 PM   Nasopharynge  Result Value Ref Range Status   SARS-CoV-2, NAA 2 DAY TAT Performed  Final    Labs: CBC: Recent Labs  Lab 06/25/23 0842 06/26/23 0607  WBC 14.9* 8.3  HGB 13.7 12.2*  HCT 42.8 38.2*  MCV 83.8 86.4  PLT 347 269   Basic Metabolic Panel: Recent Labs  Lab 06/25/23 0842 06/26/23 0607  NA 139 136  K 3.9 4.2  CL 101 104  CO2 27 27  GLUCOSE 136* 91  BUN 12 13  CREATININE 1.37* 1.06  CALCIUM 9.2 8.5*   Liver Function Tests: Recent Labs  Lab 06/25/23 0842  AST 41  ALT 20  ALKPHOS 102  BILITOT 0.6  PROT 7.9  ALBUMIN 4.3   CBG: No results for input(s): "GLUCAP" in the last 168 hours.  Discharge time spent: approximately 25 minutes spent on discharge counseling, evaluation of patient on day of discharge, and coordination of discharge planning with nursing, social work, pharmacy and case management  Signed: Ephriam Hashimoto, MD [facility] 06/26/2023

## 2023-06-26 NOTE — Plan of Care (Signed)

## 2023-08-09 ENCOUNTER — Ambulatory Visit: Payer: Self-pay | Admitting: Family Medicine

## 2024-01-02 ENCOUNTER — Emergency Department (HOSPITAL_COMMUNITY)
Admission: EM | Admit: 2024-01-02 | Discharge: 2024-01-02 | Disposition: A | Discharge status: Home / Self Care | Payer: Self-pay | Attending: Emergency Medicine | Admitting: Emergency Medicine

## 2024-01-02 ENCOUNTER — Encounter (HOSPITAL_COMMUNITY): Payer: Self-pay | Admitting: Emergency Medicine

## 2024-01-02 ENCOUNTER — Emergency Department (HOSPITAL_COMMUNITY): Payer: Self-pay

## 2024-01-02 ENCOUNTER — Other Ambulatory Visit: Payer: Self-pay

## 2024-01-02 DIAGNOSIS — F19921 Other psychoactive substance use, unspecified with intoxication with delirium: Secondary | ICD-10-CM

## 2024-01-02 DIAGNOSIS — F17210 Nicotine dependence, cigarettes, uncomplicated: Secondary | ICD-10-CM | POA: Insufficient documentation

## 2024-01-02 DIAGNOSIS — N179 Acute kidney failure, unspecified: Secondary | ICD-10-CM | POA: Insufficient documentation

## 2024-01-02 DIAGNOSIS — F12121 Cannabis abuse with intoxication delirium: Secondary | ICD-10-CM | POA: Insufficient documentation

## 2024-01-02 DIAGNOSIS — R4182 Altered mental status, unspecified: Secondary | ICD-10-CM | POA: Insufficient documentation

## 2024-01-02 LAB — CBC WITH DIFFERENTIAL/PLATELET
Abs Immature Granulocytes: 0.04 K/uL (ref 0.00–0.07)
Basophils Absolute: 0 K/uL (ref 0.0–0.1)
Basophils Relative: 0 %
Eosinophils Absolute: 0.2 K/uL (ref 0.0–0.5)
Eosinophils Relative: 2 %
HCT: 41.4 % (ref 39.0–52.0)
Hemoglobin: 13.2 g/dL (ref 13.0–17.0)
Immature Granulocytes: 1 %
Lymphocytes Relative: 25 %
Lymphs Abs: 1.8 K/uL (ref 0.7–4.0)
MCH: 27.2 pg (ref 26.0–34.0)
MCHC: 31.9 g/dL (ref 30.0–36.0)
MCV: 85.4 fL (ref 80.0–100.0)
Monocytes Absolute: 0.6 K/uL (ref 0.1–1.0)
Monocytes Relative: 8 %
Neutro Abs: 4.5 K/uL (ref 1.7–7.7)
Neutrophils Relative %: 64 %
Platelets: 336 K/uL (ref 150–400)
RBC: 4.85 MIL/uL (ref 4.22–5.81)
RDW: 14 % (ref 11.5–15.5)
WBC: 7.2 K/uL (ref 4.0–10.5)
nRBC: 0 % (ref 0.0–0.2)

## 2024-01-02 LAB — SALICYLATE LEVEL: Salicylate Lvl: <7 mg/dL — ABNORMAL LOW (ref 7.0–30.0)

## 2024-01-02 LAB — COMPREHENSIVE METABOLIC PANEL WITH GFR
ALT: 14 U/L (ref 0–44)
AST: 31 U/L (ref 15–41)
Albumin: 2.9 g/dL — ABNORMAL LOW (ref 3.5–5.0)
Alkaline Phosphatase: 86 U/L (ref 38–126)
Anion gap: 16 — ABNORMAL HIGH (ref 5–15)
BUN: 12 mg/dL (ref 6–20)
CO2: 20 mmol/L — ABNORMAL LOW (ref 22–32)
Calcium: 8.7 mg/dL — ABNORMAL LOW (ref 8.9–10.3)
Chloride: 105 mmol/L (ref 98–111)
Creatinine, Ser: 1.92 mg/dL — ABNORMAL HIGH (ref 0.61–1.24)
GFR, Estimated: 45 mL/min — ABNORMAL LOW (ref >60)
Glucose, Bld: 161 mg/dL — ABNORMAL HIGH (ref 70–99)
Potassium: 3.8 mmol/L (ref 3.5–5.1)
Sodium: 141 mmol/L (ref 135–145)
Total Bilirubin: 0.7 mg/dL (ref 0.0–1.2)
Total Protein: 5.7 g/dL — ABNORMAL LOW (ref 6.5–8.1)

## 2024-01-02 LAB — CK: Total CK: 315 U/L (ref 49–397)

## 2024-01-02 LAB — ACETAMINOPHEN LEVEL: Acetaminophen (Tylenol), Serum: <10 ug/mL — ABNORMAL LOW (ref 10–30)

## 2024-01-02 LAB — ETHANOL: Alcohol, Ethyl (B): <15 mg/dL (ref <15)

## 2024-01-02 MED ORDER — STERILE WATER FOR INJECTION IJ SOLN
INTRAMUSCULAR | Status: AC
  Administered 2024-01-02: 1.2 mL
  Filled 2024-01-02: qty 10

## 2024-01-02 MED ORDER — SODIUM CHLORIDE 0.9 % IV BOLUS
1000.0000 mL | Freq: Once | INTRAVENOUS | Status: AC
  Administered 2024-01-02: 1000 mL via INTRAVENOUS

## 2024-01-02 MED ORDER — ZIPRASIDONE MESYLATE 20 MG IM SOLR
10.0000 mg | Freq: Once | INTRAMUSCULAR | Status: AC
  Administered 2024-01-02: 10 mg via INTRAMUSCULAR
  Filled 2024-01-02: qty 20

## 2024-01-02 MED ORDER — ZIPRASIDONE MESYLATE 20 MG IM SOLR
20.0000 mg | Freq: Once | INTRAMUSCULAR | Status: DC
  Filled 2024-01-02: qty 20
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
## 2024-01-02 NOTE — Discharge Instructions (Addendum)
 We we evaluated you for your abnormal behavior.  We suspect your symptoms are due to smoking K2 (spice).  Your symptoms improved in the emergency department.  This drug can be extremely dangerous and cause seizures and death so please do not use this again.  We noticed on your lab test that your kidney function was slightly diminished, which suggest dehydration.  Please make sure you are drinking lots of fluids.  Please follow-up with your primary doctor to have this rechecked.  If you have any new or worsening symptoms, please return to the emergency department.
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
